# Patient Record
Sex: Female | Born: 1955 | Race: White | Hispanic: No | Marital: Single | Smoking: Never smoker
Health system: Southern US, Community
[De-identification: ages and names within clinical notes are randomized; demographics above are authoritative.]

---

## 2009-01-30 ENCOUNTER — Inpatient Hospital Stay: Payer: Self-pay | Admitting: Psychiatry

## 2021-12-04 ENCOUNTER — Emergency Department
Admission: EM | Admit: 2021-12-04 | Discharge: 2021-12-05 | Disposition: A | Discharge status: Other Acute Inpatient Hospital | Payer: Self-pay | Attending: Emergency Medicine | Admitting: Emergency Medicine

## 2021-12-04 ENCOUNTER — Emergency Department: Payer: Self-pay

## 2021-12-04 DIAGNOSIS — Z79899 Other long term (current) drug therapy: Secondary | ICD-10-CM | POA: Insufficient documentation

## 2021-12-04 DIAGNOSIS — F29 Unspecified psychosis not due to a substance or known physiological condition: Secondary | ICD-10-CM | POA: Insufficient documentation

## 2021-12-04 DIAGNOSIS — F22 Delusional disorders: Secondary | ICD-10-CM | POA: Diagnosis present

## 2021-12-04 DIAGNOSIS — Z20822 Contact with and (suspected) exposure to covid-19: Secondary | ICD-10-CM | POA: Insufficient documentation

## 2021-12-04 LAB — COMPREHENSIVE METABOLIC PANEL
ALT: 41 U/L (ref 0–44)
AST: 34 U/L (ref 15–41)
Albumin: 4.3 g/dL (ref 3.5–5.0)
Alkaline Phosphatase: 53 U/L (ref 38–126)
Anion gap: 6 (ref 5–15)
BUN: 14 mg/dL (ref 8–23)
CO2: 25 mmol/L (ref 22–32)
Calcium: 9.2 mg/dL (ref 8.9–10.3)
Chloride: 110 mmol/L (ref 98–111)
Creatinine, Ser: 0.78 mg/dL (ref 0.44–1.00)
GFR, Estimated: >60 mL/min (ref >60)
Glucose, Bld: 101 mg/dL — ABNORMAL HIGH (ref 70–99)
Potassium: 4.4 mmol/L (ref 3.5–5.1)
Sodium: 141 mmol/L (ref 135–145)
Total Bilirubin: 0.4 mg/dL (ref 0.3–1.2)
Total Protein: 7.1 g/dL (ref 6.5–8.1)

## 2021-12-04 LAB — URINE DRUG SCREEN, QUALITATIVE (ARMC ONLY)
Amphetamines, Ur Screen: NOT DETECTED
Barbiturates, Ur Screen: NOT DETECTED
Benzodiazepine, Ur Scrn: NOT DETECTED
Cannabinoid 50 Ng, Ur ~~LOC~~: NOT DETECTED
Cocaine Metabolite,Ur ~~LOC~~: NOT DETECTED
MDMA (Ecstasy)Ur Screen: NOT DETECTED
Methadone Scn, Ur: NOT DETECTED
Opiate, Ur Screen: NOT DETECTED
Phencyclidine (PCP) Ur S: NOT DETECTED
Tricyclic, Ur Screen: NOT DETECTED

## 2021-12-04 LAB — CBC
HCT: 43.7 % (ref 36.0–46.0)
Hemoglobin: 13.8 g/dL (ref 12.0–15.0)
MCH: 29.9 pg (ref 26.0–34.0)
MCHC: 31.6 g/dL (ref 30.0–36.0)
MCV: 94.6 fL (ref 80.0–100.0)
Platelets: 160 10*3/uL (ref 150–400)
RBC: 4.62 MIL/uL (ref 3.87–5.11)
RDW: 12.7 % (ref 11.5–15.5)
WBC: 7.3 10*3/uL (ref 4.0–10.5)
nRBC: 0 % (ref 0.0–0.2)

## 2021-12-04 LAB — RESP PANEL BY RT-PCR (FLU A&B, COVID) ARPGX2
Influenza A by PCR: NEGATIVE
Influenza B by PCR: NEGATIVE
SARS Coronavirus 2 by RT PCR: NEGATIVE

## 2021-12-04 LAB — ETHANOL: Alcohol, Ethyl (B): <10 mg/dL (ref <10)

## 2021-12-04 LAB — ACETAMINOPHEN LEVEL: Acetaminophen (Tylenol), Serum: <10 ug/mL — ABNORMAL LOW (ref 10–30)

## 2021-12-04 LAB — SALICYLATE LEVEL: Salicylate Lvl: <7 mg/dL — ABNORMAL LOW (ref 7.0–30.0)

## 2021-12-04 NOTE — ED Notes (Addendum)
Pt belongings include one dress, two yellow shoes, one yellow bangle, one yellow necklace, one slip. 1/1 belongings bag.

## 2021-12-04 NOTE — ED Provider Notes (Signed)
[facility] Provider Note    Event Date/Time   First MD Initiated Contact with Patient 12/04/21 1654     (approximate)   History   Psychiatric Evaluation   HPI  Leyli Montemayor is a 66 y.o. female with no available past medical history who presents for psychiatric evaluation.  Per IVC paperwork patient was found wandering through neighborhood, appeared confused, complaining of fire ants.  Patient is significantly fixated on fire ants.  She also reports that she is "the neighborhood watch ".  She also reports that she has been arrested several times before and that there is bad blood in the neighborhood     Physical Exam   Triage Vital Signs: ED Triage Vitals  Enc Vitals Group     BP 12/04/21 1652 (!) 143/75     Pulse Rate 12/04/21 1652 (!) 119     Resp 12/04/21 1652 18     Temp 12/04/21 1652 98.9 F (37.2 C)     Temp Source 12/04/21 1652 Oral     SpO2 12/04/21 1652 94 %     Weight 12/04/21 1654 81.6 kg (180 lb)     Height --      Head Circumference --      Peak Flow --      Pain Score 12/04/21 1652 0     Pain Loc --      Pain Edu? --      Excl. in GC? --     Most recent vital signs: Vitals:   12/04/21 1652 12/04/21 1928  BP: (!) 143/75 139/81  Pulse: (!) 119 (!) 134  Resp: 18 18  Temp: 98.9 F (37.2 C) 98.1 F (36.7 C)  SpO2: 94% 100%     General: Awake, no distress.  CV:  Good peripheral perfusion.  Resp:  Normal effort.  Abd:  No distention.  Other:  Psych: Fixation on fire ants and "controlling them with compost ".  Does know that she is in the emergency department.  No SI or HI reported   ED Results / Procedures / Treatments   Labs (all labs ordered are listed, but only abnormal results are displayed) Labs Reviewed  COMPREHENSIVE METABOLIC PANEL - Abnormal; Notable for the following components:      Result Value   Glucose, Bld 101 (*)    All other components within normal limits  SALICYLATE LEVEL - Abnormal; Notable for  the following components:   Salicylate Lvl <7.0 (*)    All other components within normal limits  ACETAMINOPHEN LEVEL - Abnormal; Notable for the following components:   Acetaminophen (Tylenol), Serum <10 (*)    All other components within normal limits  RESP PANEL BY RT-PCR (FLU A&B, COVID) ARPGX2  ETHANOL  CBC  URINE DRUG SCREEN, QUALITATIVE ([facility] ONLY)     EKG     RADIOLOGY     PROCEDURES:  Critical Care performed:   Procedures   MEDICATIONS ORDERED IN ED: Medications - No data to display   IMPRESSION / MDM / ASSESSMENT AND PLAN / ED COURSE  I reviewed the triage vital signs and the nursing notes. Patient's presentation is most consistent with acute illness / injury with system symptoms.  Patient presents under IVC paperwork with the police for abnormal behavior.  After eval I have completed the IVC  The patient does appear to be suffering from psychosis  Lab work reviewed and is reassuring, the patient is medically cleared for psychiatric evaluation CBC is normal, BMP is  normal, acetaminophen and salicylate levels are normal.  UDS was clear   The patient has been placed in psychiatric observation due to the need to provide a safe environment for the patient while obtaining psychiatric consultation and evaluation, as well as ongoing medical and medication management to treat the patient's condition.  The patient has been placed under full IVC at this time.    Patient seen by psychiatry, they do plan to admit the patient      FINAL CLINICAL IMPRESSION(S) / ED DIAGNOSES   Final diagnoses:  Psychosis, unspecified psychosis type (HCC)     Rx / DC Orders   ED Discharge Orders     None        Note:  This document was prepared using Dragon voice recognition software and may include unintentional dictation errors.   Jene Every, MD 12/04/21 2300

## 2021-12-04 NOTE — ED Notes (Signed)
IVC, pt came in on IVC, pend MD eval

## 2021-12-04 NOTE — ED Notes (Signed)
Pt given nighttime snack. 
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
## 2021-12-04 NOTE — BH Assessment (Signed)
Referral information for Psychiatric Hospitalization faxed to:  [facility] [phone]) Unit unable to accept pts at this time.  [facility] [phone]),   [facility] [phone]) [phone]  [facility] [phone]),  [facility] [phone]- [phone]),   [facility] [phone] or [phone]),

## 2021-12-05 ENCOUNTER — Encounter: Payer: Self-pay | Admitting: Psychiatry

## 2021-12-05 ENCOUNTER — Other Ambulatory Visit: Payer: Self-pay

## 2021-12-05 ENCOUNTER — Inpatient Hospital Stay
Admission: AD | Admit: 2021-12-05 | Discharge: 2021-12-09 | DRG: 885 | Disposition: A | Discharge status: Home / Self Care | Payer: 59 | Source: Intra-hospital | Attending: Psychiatry | Admitting: Psychiatry

## 2021-12-05 DIAGNOSIS — Z592 Discord with neighbors, lodgers and landlord: Secondary | ICD-10-CM

## 2021-12-05 DIAGNOSIS — W57XXXA Bitten or stung by nonvenomous insect and other nonvenomous arthropods, initial encounter: Secondary | ICD-10-CM | POA: Diagnosis present

## 2021-12-05 DIAGNOSIS — F32A Depression, unspecified: Secondary | ICD-10-CM | POA: Diagnosis present

## 2021-12-05 DIAGNOSIS — F419 Anxiety disorder, unspecified: Secondary | ICD-10-CM | POA: Diagnosis present

## 2021-12-05 DIAGNOSIS — F29 Unspecified psychosis not due to a substance or known physiological condition: Secondary | ICD-10-CM | POA: Insufficient documentation

## 2021-12-05 DIAGNOSIS — Z560 Unemployment, unspecified: Secondary | ICD-10-CM

## 2021-12-05 DIAGNOSIS — F22 Delusional disorders: Secondary | ICD-10-CM

## 2021-12-05 DIAGNOSIS — Z639 Problem related to primary support group, unspecified: Secondary | ICD-10-CM

## 2021-12-05 MED ORDER — ALUM & MAG HYDROXIDE-SIMETH 200-200-20 MG/5ML PO SUSP: 30.0000 mL | ORAL | Status: DC | PRN

## 2021-12-05 MED ORDER — MAGNESIUM HYDROXIDE 400 MG/5ML PO SUSP: 30.0000 mL | Freq: Every day | ORAL | Status: DC | PRN

## 2021-12-05 MED ORDER — HYDROXYZINE HCL 10 MG PO TABS: 10.0000 mg | ORAL_TABLET | Freq: Three times a day (TID) | ORAL | Status: DC | PRN

## 2021-12-05 MED ORDER — HYDROXYZINE HCL 10 MG PO TABS
10.0000 mg | ORAL_TABLET | Freq: Three times a day (TID) | ORAL | Status: DC | PRN
Start: 2021-12-05 — End: 2021-12-09

## 2021-12-05 MED ORDER — ACETAMINOPHEN 325 MG PO TABS
650.0000 mg | ORAL_TABLET | Freq: Four times a day (QID) | ORAL | Status: DC | PRN
Start: 2021-12-05 — End: 2021-12-09

## 2021-12-05 NOTE — ED Notes (Signed)
Patient transferred to room 4 BHU, oriented Patient to the unit, will continue to monitor.

## 2021-12-05 NOTE — Progress Notes (Signed)
Patient is alert and oriented x4. Patient speech pressured and tangential. Patient able to answer assessment questions, but frequently changes topics. She is able to be easily redirected. Patient denies SI, HI, and AVH. She says that she is upset because she was arrested several times for trespassing into her neighbors places, and that it was all based on lies and fabrications. Patient also states that her brother and her family are a part of the lies. She says she feels she does not need to be in the hospital and she does not like taking medications.   Patient ambulates independently. Gait steady. However, patient says "I have vertigo". Patient unable to recall when it started. Patient is oriented to the unit and ate dinner tray and took a shower. Patient remains safe on the unit at this time.

## 2021-12-05 NOTE — ED Notes (Signed)
Patient will be transferring to room 30 in [facility] psych. , Nurse called report to [facility].

## 2021-12-05 NOTE — ED Notes (Signed)
Patient is on the phone at this time, states she had to call her attorney, because she has court on Wednesday., Nurse will continue to monitor. Patient is safe, q 15 minute checks and camera surveillance in progress.
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
## 2021-12-05 NOTE — Tx Team (Signed)
Initial Treatment Plan 12/05/2021 6:07 PM [name] LDJ:[id number]   PATIENT STRESSORS: Legal issue   Marital or family conflict     PATIENT STRENGTHS: Capable of independent living  Physical Health    PATIENT IDENTIFIED PROBLEMS: Legal issues  Lack of support                   DISCHARGE CRITERIA:  Improved stabilization in mood, thinking, and/or behavior Need for constant or close observation no longer present  PRELIMINARY DISCHARGE PLAN: Return to previous living arrangement  PATIENT/FAMILY INVOLVEMENT: This treatment plan has been presented to and reviewed with the patient, Aimee Mitchell.  The patient has been given the opportunity to ask questions and make suggestions.  Celene Kras, RN 12/05/2021, 6:07 PM

## 2021-12-05 NOTE — ED Notes (Signed)
Pt given ice chips at this time 

## 2021-12-05 NOTE — BH Assessment (Signed)
Patient is to be admitted to [facility] Unit by Dr. Toni Amend.  Attending Physician will be Dr.  Toni Amend .   Patient has been assigned to room 30, by [facility] Charge Nurse, Marchelle Folks.   Intake Paper Work has been signed and placed on patient chart.  ER staff is aware of the admission: Misty Stanley, ER Secretary   Dr. Fuller Plan, ER MD  Toniann Fail, Patient's Nurse  Ethelene Browns, Patient Access.

## 2021-12-05 NOTE — ED Notes (Signed)
Unlocked bathroom door to allow patient to shower.  Staff continuously monitored dayroom outside of bathroom door during pt shower.  Pt was given hygiene items as well as:  I clean top, 1 clean bottom, with 1 pair of disposable underwear.  Pt changed out into clean clothing.  Staff disposed of all shower supplies. Shower room cleaned and secured for next use.  

## 2021-12-05 NOTE — ED Notes (Signed)
Pt given breakfast tray and drink 

## 2021-12-05 NOTE — ED Notes (Addendum)
Patient continuously exiting room, repeating that she was told last night that she would be free to go and requesting updates on when she will be released. This RN and officers present in the quad telling patient and reiterating that she believes incorrect information and that at this time patient is unable to leave due to IVC order. Patient told to return to room to await disposition and to maintain patient safety. Patient then saying that she has a court order on Wednesday and that she needs to leave.

## 2021-12-05 NOTE — ED Notes (Signed)
Hospital meal provided, pt tolerated w/o complaints.  Waste discarded appropriately.  

## 2021-12-05 NOTE — Progress Notes (Signed)
   12/05/21 1500  Clinical Encounter Type  Visited With Patient  Visit Type Initial  Referral From Nurse  Consult/Referral To Chaplain   Chaplain responded to nurse consult. Chaplain provided a compassionate presence and reflective listening as patient spoke about hospital stay. Chaplain provided a Bible and prayer as requested.

## 2021-12-06 DIAGNOSIS — F22 Delusional disorders: Secondary | ICD-10-CM

## 2021-12-06 LAB — URINALYSIS, COMPLETE (UACMP) WITH MICROSCOPIC
Bilirubin Urine: NEGATIVE
Glucose, UA: NEGATIVE mg/dL
Hgb urine dipstick: NEGATIVE
Ketones, ur: NEGATIVE mg/dL
Nitrite: NEGATIVE
Protein, ur: NEGATIVE mg/dL
Specific Gravity, Urine: 1.02 (ref 1.005–1.030)
pH: 6 (ref 5.0–8.0)

## 2021-12-06 MED ORDER — OLANZAPINE 10 MG IM SOLR
5.0000 mg | Freq: Two times a day (BID) | INTRAMUSCULAR | Status: DC
  Administered 2021-12-06 – 2021-12-08 (×3): 5 mg via INTRAMUSCULAR
  Filled 2021-12-06 (×4): qty 10

## 2021-12-06 MED ORDER — HYDROCORTISONE 0.5 % EX CREA
TOPICAL_CREAM | Freq: Three times a day (TID) | CUTANEOUS | Status: DC | PRN
  Administered 2021-12-08: 1 via TOPICAL
  Filled 2021-12-06: qty 28.35

## 2021-12-06 MED ORDER — OLANZAPINE 5 MG PO TABS
5.0000 mg | ORAL_TABLET | Freq: Two times a day (BID) | ORAL | Status: DC
  Filled 2021-12-06 (×5): qty 1

## 2021-12-06 NOTE — BH Assessment (Addendum)
Received patient sitting in the dayroom, she was invited to sit in on the visit with another patient and the patient confirmed it with this writer that it was ok for the [patient to sit with her. She is currently denying SI/HI, A/V hallucinations. Her mood is upbeat and she is hyper-verbal. Pt. is alert and oriented x3 pleasant and cooperative with this patient encounter but informs this writer that she will not take any psychotic medications. Patient medication schedule and regimen discussed with her.   2212 Patient informed and educated on her HS medication and refused to take the PO Olanzapine 5 mg. Patient is aware that medication is an either or medication and that the injection of 5 mg will be administered. Also, spoke with Mikki Santee, NP to get a second clarification on the Olanzapine 5 mg PO or IM if patient refuses PO Olanzapine.   2247 Patient approached for HS medication injection. She was asked several times if she would except PO olanzapine and patient Stated " No I'm not going to take any medication; its nothing wrong with me." The process of IM injected was explained and pt. Was given  with great resistance but medication administration was successful.  5697 Patient has remained in bed resting with eyes closed. She has frequently changed position and at onetime told the staff to get out of her room.   0630 Patient awaken up in day room. When asked if she needed a cup for coffee  and she wanted to know if when had a certain type of cocco. Patient became angry and wave her hand and walked away. Patient is still angry about receiving IM medication at HS. This Clinical research associate will continue to monitor patient for safety.
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
## 2021-12-06 NOTE — Progress Notes (Signed)
Patient came to nurses station yelling at staff about another patient needing a home and stating that someone here needs to do their damn job.

## 2021-12-06 NOTE — [facility] Suicide Risk Assessment (Signed)
[facility] Admission Suicide Risk Assessment   Nursing information obtained from:  Patient Demographic factors:  NA Current Mental Status:  NA Loss Factors:  NA Historical Factors:  NA Risk Reduction Factors:  NA  Total Time spent with patient: 45 minutes Principal Problem: Delusional disorder (HCC) Diagnosis:  Principal Problem:   Delusional disorder (HCC)  Subjective Data: "This is a scheme with her brother and neighbors to get her out of my house."   Continued Clinical Symptoms:  Alcohol Use Disorder Identification Test Final Score (AUDIT): 1 The "Alcohol Use Disorders Identification Test", Guidelines for Use in Primary Care, Second Edition.  World Science writer [facility]). Score between 0-7:  no or low risk or alcohol related problems. Score between 8-15:  moderate risk of alcohol related problems. Score between 16-19:  high risk of alcohol related problems. Score 20 or above:  warrants further diagnostic evaluation for alcohol dependence and treatment.   CLINICAL FACTORS:   Delusional disorder, paranoid   Musculoskeletal: Strength & Muscle Tone: within normal limits Gait & Station: normal Patient leans: N/A  General Appearance: Casual  Eye Contact:  Good  Speech:  Pressured at times, hyperverbal  Volume:  Normal  Mood:  Anxious  Affect:  Congruent  Thought Process:  Descriptions of Associations: Tangential  Orientation:  Full (Time, Place, and Person)  Thought Content:  Delusions and Tangential  Suicidal Thoughts:  No  Homicidal Thoughts:  No  Memory:  Immediate;   Fair Recent;   Fair Remote;   Fair  Judgement:  Impaired  Insight:  Lacking  Psychomotor Activity:  Increased  Concentration:  Concentration: Poor and Attention Span: Poor  Recall:  Fair  Fund of Knowledge:  Good  Language:  Good  Akathisia:  None  Handed:  Right  AIMS (if indicated):     Assets:  Housing Leisure Time Physical Health Resilience  ADL's:  Intact  Cognition:  Impaired,  Mild   Sleep:         Physical Exam: Physical Exam Vitals and nursing note reviewed.  Constitutional:      Appearance: Normal appearance.  HENT:     Head: Normocephalic.     Nose: Nose normal.  Pulmonary:     Effort: Pulmonary effort is normal.  Musculoskeletal:        General: Normal range of motion.     Cervical back: Normal range of motion.  Neurological:     General: No focal deficit present.     Mental Status: She is alert and oriented to person, place, and time.  Psychiatric:        Attention and Perception: She is inattentive.        Mood and Affect: Mood is anxious and elated.        Speech: Speech is tangential.        Behavior: Behavior is hyperactive.        Thought Content: Thought content is paranoid and delusional.        Cognition and Memory: Cognition is impaired.        Judgment: Judgment is impulsive and inappropriate.    Review of Systems  Neurological:  Positive for dizziness.  Psychiatric/Behavioral:  The patient is nervous/anxious.   All other systems reviewed and are negative.  Blood pressure (!) 145/74, pulse 89, temperature 97.8 F (36.6 C), resp. rate 18, height 5\' 8"  (1.727 m), weight 74.6 kg, SpO2 100 %. Body mass index is 25.01 kg/m.   COGNITIVE FEATURES THAT CONTRIBUTE TO RISK:  None  SUICIDE RISK:   Mild:  Suicidal ideation of limited frequency, intensity, duration, and specificity.  There are no identifiable plans, no associated intent, mild dysphoria and related symptoms, good self-control (both objective and subjective assessment), few other risk factors, and identifiable protective factors, including available and accessible social support.  PLAN OF CARE:  Delusional disorder: Started Zyprexa 5 mg BID  I certify that inpatient services furnished can reasonably be expected to improve the patient's condition.   Nanine Means, NP 12/06/2021, 4:18 PM

## 2021-12-06 NOTE — Progress Notes (Signed)
Patient back at nurses station demanding to see the chaplain and have hydrocortisone cream. MD notified and orders placed

## 2021-12-06 NOTE — Progress Notes (Signed)
Patient is seen in the dayroom interacting with others. She is A&Ox4. She denies SI, HI, and AVH, anxiety, and depression. States she doesn't have any mental issues. She denies pain. Patient is tangential and has flight of ideas when engaged in conversation. She is restless and did not sleep well last night. She is safe on the unit at this time in no current distress. Q 15 min safety checks in place.

## 2021-12-06 NOTE — [facility] Suicide Risk Assessment (Signed)
[place] INPATIENT:  Family/Significant Other Suicide Prevention Education  Suicide Prevention Education:   SPE completed with pt, as pt refused to consent to family contact. SPI pamphlet provided to pt and pt was encouraged to share information with support network, ask questions, and talk about any concerns relating to SPE. Pt denies access to guns/firearms and verbalized understanding of information provided. Mobile Crisis information also provided to pt.  Patient Refusal for Family/Significant Other Suicide Prevention Education: The patient Aimee Mitchell has refused to provide written consent for family/significant other to be provided Family/Significant Other Suicide Prevention Education during admission and/or prior to discharge.  Physician notified.  Yaseen Gilberg A [place] 12/06/2021, 4:43 PM

## 2021-12-06 NOTE — Progress Notes (Signed)
Patient is seen in the dayroom interacting with others. She is AAOx4. She denies SI, HI, and AVH, anxiety, and depression. She denies pain. Patient is tangential and has flight of ideas when engaged in conversation. She is restless and did not sleep well. She stated that her feet has pustules from the ant bites. She is safe on the unit at this time in no current distress. Q 15 min safety checks in place.
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
## 2021-12-06 NOTE — Plan of Care (Signed)
  Problem: Education: Goal: Knowledge of General Education information will improve Description: Including pain rating scale, medication(s)/side effects and non-pharmacologic comfort measures Outcome: Progressing   Problem: Health Behavior/Discharge Planning: Goal: Ability to manage health-related needs will improve Outcome: Progressing   Problem: Clinical Measurements: Goal: Ability to maintain clinical measurements within normal limits will improve Outcome: Progressing Goal: Will remain free from infection Outcome: Progressing Goal: Diagnostic test results will improve Outcome: Progressing Goal: Respiratory complications will improve Outcome: Progressing Goal: Cardiovascular complication will be avoided Outcome: Progressing   Problem: Safety: Goal: Ability to remain free from injury will improve Outcome: Progressing   Problem: Skin Integrity: Goal: Risk for impaired skin integrity will decrease Outcome: Progressing   Problem: Education: Goal: Knowledge of [place] General Education information/materials will improve Outcome: Progressing Goal: Emotional status will improve Outcome: Progressing Goal: Mental status will improve Outcome: Progressing Goal: Verbalization of understanding the information provided will improve Outcome: Progressing   Problem: Pain Managment: Goal: General experience of comfort will improve Outcome: Progressing   Problem: Self-Care: Goal: Ability to participate in self-care as condition permits will improve Outcome: Progressing   Problem: Self-Concept: Goal: Will verbalize positive feelings about self Outcome: Progressing

## 2021-12-07 MED ORDER — TRIAMCINOLONE 0.1 % CREAM:EUCERIN CREAM 1:1
TOPICAL_CREAM | Freq: Two times a day (BID) | CUTANEOUS | Status: DC
  Administered 2021-12-07 – 2021-12-08 (×2): 1 via TOPICAL
  Filled 2021-12-07 (×5): qty 1

## 2021-12-07 NOTE — BH IP Treatment Plan (Signed)
Interdisciplinary Treatment and Diagnostic Plan Update  12/07/2021 Time of Session: 10:30AM Aimee Mitchell MRN: [id number]  Principal Diagnosis: Delusional disorder [facility])  Secondary Diagnoses: Principal Problem:   Delusional disorder ([place])   Current Medications:  Current Facility-Administered Medications  Medication Dose Route Frequency Provider Last Rate Last Admin   acetaminophen (TYLENOL) tablet 650 mg  650 mg Oral Q6H PRN Sherlon Handing, NP       alum & mag hydroxide-simeth (MAALOX/MYLANTA) 200-200-20 MG/5ML suspension 30 mL  30 mL Oral Q4H PRN Sherlon Handing, NP       hydrocortisone cream 0.5 %   Topical TID PRN Clapacs, Madie Reno, MD       hydrOXYzine (ATARAX) tablet 10 mg  10 mg Oral TID PRN Sherlon Handing, NP       magnesium hydroxide (MILK OF MAGNESIA) suspension 30 mL  30 mL Oral Daily PRN Waldon Merl F, NP       OLANZapine (ZYPREXA) tablet 5 mg  5 mg Oral BID Patrecia Pour, NP       Or   OLANZapine (ZYPREXA) injection 5 mg  5 mg Intramuscular BID Patrecia Pour, NP   5 mg at 12/07/21 0809   PTA Medications: No medications prior to admission.    Patient Stressors: Legal issue   Marital or family conflict    Patient Strengths: Capable of independent living  Physical Health   Treatment Modalities: Medication Management, Group therapy, Case management,  1 to 1 session with clinician, Psychoeducation, Recreational therapy.   Physician Treatment Plan for Primary Diagnosis: Delusional disorder [facility]) Long Term Goal(s): Improvement in symptoms so as ready for discharge   Short Term Goals: Ability to identify changes in lifestyle to reduce recurrence of condition will improve Ability to verbalize feelings will improve Ability to disclose and discuss suicidal ideas Ability to demonstrate self-control will improve Ability to identify and develop effective coping behaviors will improve Ability to maintain clinical measurements within normal limits will  improve Compliance with prescribed medications will improve  Medication Management: Evaluate patient's response, side effects, and tolerance of medication regimen.  Therapeutic Interventions: 1 to 1 sessions, Unit Group sessions and Medication administration.  Evaluation of Outcomes: Not Met  Physician Treatment Plan for Secondary Diagnosis: Principal Problem:   Delusional disorder ([place])  Long Term Goal(s): Improvement in symptoms so as ready for discharge   Short Term Goals: Ability to identify changes in lifestyle to reduce recurrence of condition will improve Ability to verbalize feelings will improve Ability to disclose and discuss suicidal ideas Ability to demonstrate self-control will improve Ability to identify and develop effective coping behaviors will improve Ability to maintain clinical measurements within normal limits will improve Compliance with prescribed medications will improve     Medication Management: Evaluate patient's response, side effects, and tolerance of medication regimen.  Therapeutic Interventions: 1 to 1 sessions, Unit Group sessions and Medication administration.  Evaluation of Outcomes: Not Met   RN Treatment Plan for Primary Diagnosis: Delusional disorder ([place]) Long Term Goal(s): Knowledge of disease and therapeutic regimen to maintain health will improve  Short Term Goals: Ability to remain free from injury will improve, Ability to verbalize frustration and anger appropriately will improve, Ability to demonstrate self-control, Ability to participate in decision making will improve, Ability to verbalize feelings will improve, Ability to identify and develop effective coping behaviors will improve, and Compliance with prescribed medications will improve  Medication Management: RN will administer medications as ordered by provider, will assess and evaluate patient's  response and provide education to patient for prescribed medication. RN will report any  adverse and/or side effects to prescribing provider.  Therapeutic Interventions: 1 on 1 counseling sessions, Psychoeducation, Medication administration, Evaluate responses to treatment, Monitor vital signs and CBGs as ordered, Perform/monitor CIWA, COWS, AIMS and Fall Risk screenings as ordered, Perform wound care treatments as ordered.  Evaluation of Outcomes: Not Met   LCSW Treatment Plan for Primary Diagnosis: Delusional disorder [facility]) Long Term Goal(s): Safe transition to appropriate next level of care at discharge, Engage patient in therapeutic group addressing interpersonal concerns.  Short Term Goals: Engage patient in aftercare planning with referrals and resources, Increase social support, Increase ability to appropriately verbalize feelings, Increase emotional regulation, Facilitate acceptance of mental health diagnosis and concerns, and Increase skills for wellness and recovery  Therapeutic Interventions: Assess for all discharge needs, 1 to 1 time with Social worker, Explore available resources and support systems, Assess for adequacy in community support network, Educate family and significant other(s) on suicide prevention, Complete Psychosocial Assessment, Interpersonal group therapy.  Evaluation of Outcomes: Not Met   Progress in Treatment: Attending groups: Yes. Participating in groups: Yes. Taking medication as prescribed: No. Toleration medication: Yes. Family/Significant other contact made: No, will contact:  SPE completed with pt. Pt declined collateral contact Patient understands diagnosis: No. Discussing patient identified problems/goals with staff: No. Medical problems stabilized or resolved: Yes. Denies suicidal/homicidal ideation: Yes. Issues/concerns per patient self-inventory: No. Other: None  New problem(s) identified: No, Describe:  None  New Short Term/Long Term Goal(s): Patient to work towards elimination of symptoms of psychosis, medication management  for mood stabilization; development of comprehensive mental wellness plan.   Patient Goals:  Pt declined to participate in treatment team and declined to provide pt goals.   Discharge Plan or Barriers: CSW will assist pt with development of appropriate discharge/aftercare plan.   Reason for Continuation of Hospitalization: Delusions  Medication stabilization  Estimated Length of Stay: 1-7 days  Last 3 [place] Suicide Severity Risk Score: Flowsheet Row Admission (Current) from 12/05/2021 in [place] ED from 12/04/2021 in [place] No Risk No Risk       Last PHQ 2/9 Scores:     No data to display          Scribe for Treatment Team: Tamieka Rancourt A [place], Latanya Presser 12/07/2021 1:33 PM

## 2021-12-07 NOTE — Progress Notes (Signed)
Patient staring at staff and sizing them up. Patient also came to nurses station and took a Glucerna that wasn't ordered for her. She proceeded to drink the Glucerna and call the staff scammers.

## 2021-12-07 NOTE — Progress Notes (Signed)
Patient alert and oriented Xs 4. Unable to do a complete assessment d/t patients behaviors and uncooperativeness. Patient is tangential with pressured speech. She is also labile and boisterous. Mood and affect is angry and blunted. Eye contact is glaring and intense. Thoughts are disorganized. Patient is aggressive and preoccupied with what is going on with other patients, being a patient advocate, and taking a stand for humanity.

## 2021-12-07 NOTE — Plan of Care (Signed)
Problem: Education: Goal: Knowledge of General Education information will improve Description: Including pain rating scale, medication(s)/side effects and non-pharmacologic comfort measures 12/07/2021 1408 by Doneen Poisson, RN Outcome: Progressing 12/07/2021 1138 by Doneen Poisson, RN Outcome: Not Progressing   Problem: Health Behavior/Discharge Planning: Goal: Ability to manage health-related needs will improve 12/07/2021 1408 by Doneen Poisson, RN Outcome: Progressing 12/07/2021 1138 by Doneen Poisson, RN Outcome: Not Progressing   Problem: Clinical Measurements: Goal: Ability to maintain clinical measurements within normal limits will improve 12/07/2021 1408 by Doneen Poisson, RN Outcome: Progressing 12/07/2021 1138 by Doneen Poisson, RN Outcome: Not Progressing Goal: Will remain free from infection 12/07/2021 1408 by Doneen Poisson, RN Outcome: Progressing 12/07/2021 1138 by Doneen Poisson, RN Outcome: Not Progressing Goal: Diagnostic test results will improve 12/07/2021 1408 by Doneen Poisson, RN Outcome: Progressing 12/07/2021 1138 by Doneen Poisson, RN Outcome: Not Progressing Goal: Respiratory complications will improve 12/07/2021 1408 by Doneen Poisson, RN Outcome: Progressing 12/07/2021 1138 by Doneen Poisson, RN Outcome: Not Progressing Goal: Cardiovascular complication will be avoided 12/07/2021 1408 by Doneen Poisson, RN Outcome: Progressing 12/07/2021 1138 by Doneen Poisson, RN Outcome: Not Progressing   Problem: Activity: Goal: Risk for activity intolerance will decrease 12/07/2021 1408 by Doneen Poisson, RN Outcome: Progressing 12/07/2021 1138 by Doneen Poisson, RN Outcome: Not Progressing   Problem: Nutrition: Goal: Adequate nutrition will be maintained 12/07/2021 1408 by Doneen Poisson, RN Outcome: Progressing 12/07/2021 1138 by Doneen Poisson, RN Outcome: Not Progressing   Problem:  Coping: Goal: Level of anxiety will decrease 12/07/2021 1408 by Doneen Poisson, RN Outcome: Progressing 12/07/2021 1138 by Doneen Poisson, RN Outcome: Not Progressing   Problem: Elimination: Goal: Will not experience complications related to bowel motility 12/07/2021 1408 by Doneen Poisson, RN Outcome: Progressing 12/07/2021 1138 by Doneen Poisson, RN Outcome: Not Progressing Goal: Will not experience complications related to urinary retention 12/07/2021 1408 by Doneen Poisson, RN Outcome: Progressing 12/07/2021 1138 by Doneen Poisson, RN Outcome: Not Progressing   Problem: Pain Managment: Goal: General experience of comfort will improve 12/07/2021 1408 by Doneen Poisson, RN Outcome: Progressing 12/07/2021 1138 by Doneen Poisson, RN Outcome: Not Progressing   Problem: Safety: Goal: Ability to remain free from injury will improve 12/07/2021 1408 by Doneen Poisson, RN Outcome: Progressing 12/07/2021 1138 by Doneen Poisson, RN Outcome: Not Progressing   Problem: Skin Integrity: Goal: Risk for impaired skin integrity will decrease 12/07/2021 1408 by Doneen Poisson, RN Outcome: Progressing 12/07/2021 1138 by Doneen Poisson, RN Outcome: Not Progressing   Problem: Education: Goal: Knowledge of [place] [facility] Education information/materials will improve 12/07/2021 1408 by Doneen Poisson, RN Outcome: Progressing 12/07/2021 1138 by Doneen Poisson, RN Outcome: Not Progressing Goal: Emotional status will improve 12/07/2021 1408 by Doneen Poisson, RN Outcome: Progressing 12/07/2021 1138 by Doneen Poisson, RN Outcome: Not Progressing Goal: Mental status will improve 12/07/2021 1408 by Doneen Poisson, RN Outcome: Progressing 12/07/2021 1138 by Doneen Poisson, RN Outcome: Not Progressing Goal: Verbalization of understanding the information provided will improve 12/07/2021 1408 by Doneen Poisson, RN Outcome:  Progressing 12/07/2021 1138 by Doneen Poisson, RN Outcome: Not Progressing   Problem: Activity: Goal: Interest or engagement in activities will improve 12/07/2021 1408 by Doneen Poisson, RN Outcome: Progressing 12/07/2021 1138 by Doneen Poisson, RN Outcome: Not Progressing Goal: Sleeping patterns will improve 12/07/2021 1408 by  Doneen Poisson, RN Outcome: Progressing 12/07/2021 1138 by Doneen Poisson, RN Outcome: Not Progressing   Problem: Coping: Goal: Ability to verbalize frustrations and anger appropriately will improve 12/07/2021 1408 by Doneen Poisson, RN Outcome: Progressing 12/07/2021 1138 by Doneen Poisson, RN Outcome: Not Progressing Goal: Ability to demonstrate self-control will improve 12/07/2021 1408 by Doneen Poisson, RN Outcome: Progressing 12/07/2021 1138 by Doneen Poisson, RN Outcome: Not Progressing   Problem: Health Behavior/Discharge Planning: Goal: Identification of resources available to assist in meeting health care needs will improve 12/07/2021 1408 by Doneen Poisson, RN Outcome: Progressing 12/07/2021 1138 by Doneen Poisson, RN Outcome: Not Progressing Goal: Compliance with treatment plan for underlying cause of condition will improve 12/07/2021 1408 by Doneen Poisson, RN Outcome: Progressing 12/07/2021 1138 by Doneen Poisson, RN Outcome: Not Progressing   Problem: Physical Regulation: Goal: Ability to maintain clinical measurements within normal limits will improve 12/07/2021 1408 by Doneen Poisson, RN Outcome: Progressing 12/07/2021 1138 by Doneen Poisson, RN Outcome: Not Progressing   Problem: Safety: Goal: Periods of time without injury will increase 12/07/2021 1408 by Doneen Poisson, RN Outcome: Progressing 12/07/2021 1138 by Doneen Poisson, RN Outcome: Not Progressing   Problem: Activity: Goal: Will verbalize the importance of balancing activity with adequate rest periods 12/07/2021  1408 by Doneen Poisson, RN Outcome: Progressing 12/07/2021 1138 by Doneen Poisson, RN Outcome: Not Progressing   Problem: Education: Goal: Will be free of psychotic symptoms 12/07/2021 1408 by Doneen Poisson, RN Outcome: Progressing 12/07/2021 1138 by Doneen Poisson, RN Outcome: Not Progressing Goal: Knowledge of the prescribed therapeutic regimen will improve 12/07/2021 1408 by Doneen Poisson, RN Outcome: Progressing 12/07/2021 1138 by Doneen Poisson, RN Outcome: Not Progressing   Problem: Coping: Goal: Coping ability will improve 12/07/2021 1408 by Doneen Poisson, RN Outcome: Progressing 12/07/2021 1138 by Doneen Poisson, RN Outcome: Not Progressing Goal: Will verbalize feelings 12/07/2021 1408 by Doneen Poisson, RN Outcome: Progressing 12/07/2021 1138 by Doneen Poisson, RN Outcome: Not Progressing   Problem: Health Behavior/Discharge Planning: Goal: Compliance with prescribed medication regimen will improve 12/07/2021 1408 by Doneen Poisson, RN Outcome: Progressing 12/07/2021 1138 by Doneen Poisson, RN Outcome: Not Progressing   Problem: Nutritional: Goal: Ability to achieve adequate nutritional intake will improve 12/07/2021 1408 by Doneen Poisson, RN Outcome: Progressing 12/07/2021 1138 by Doneen Poisson, RN Outcome: Not Progressing   Problem: Role Relationship: Goal: Ability to communicate needs accurately will improve 12/07/2021 1408 by Doneen Poisson, RN Outcome: Progressing 12/07/2021 1138 by Doneen Poisson, RN Outcome: Not Progressing Goal: Ability to interact with others will improve 12/07/2021 1408 by Doneen Poisson, RN Outcome: Progressing 12/07/2021 1138 by Doneen Poisson, RN Outcome: Not Progressing   Problem: Self-Care: Goal: Ability to participate in self-care as condition permits will improve 12/07/2021 1408 by Doneen Poisson, RN Outcome: Progressing 12/07/2021 1138 by Doneen Poisson, RN Outcome: Not Progressing   Problem: Self-Concept: Goal: Will verbalize positive feelings about self 12/07/2021 1408 by Doneen Poisson, RN Outcome: Progressing 12/07/2021 1138 by Doneen Poisson, RN Outcome: Not Progressing   Problem: Safety: Goal: Violent Restraint(s) Outcome: Progressing   Problem: Safety: Goal: Violent Restraint(s) Outcome: Progressing

## 2021-12-07 NOTE — Progress Notes (Addendum)
   12/07/21 0748  Clinical Encounter Type  Visited With Other (Comment) (Note for staff)   Chaplain Sophronia Simas was available last night for support but did not see the consult request until this morning. Will refer to chaplain on duty this a.m. for pt f/u.  Please consider calling or paging the chaplain especially in the evening, even if it may not seem "urgent." Consults are not always seen until the following day; this chaplain may have been able to offer support to pt and staff in a more timely way.  Staff may direct dial on-call phone: [phone] OR send a page for call-back:  [phone]

## 2021-12-07 NOTE — Progress Notes (Signed)
As the breakfast trays arrived to the unit, patient took it upon herself to grab a Glucerna from the cart and go to her room. When this nurse asked her to return the Glucerna she proceeded to drink it and then throw the empty carton across the room. Patient then began yelling, this is elder abuse and you all are scammers. When this nurse returned to give pt her scheduled Zyprexa she refused stating "fuck you" "get the fuck out my room". I attempted to explain to her that she needed her medicine and if she continued I would have to give her an injection. Security was called and once they arrived pt continued yelling, screaming, and calling staff members out of their name. She then threw a pillow at this writer stating "I'm not taking anything, "I am not psychotic". Patient then proceeded to barricade herself in between the bed and wall. Security was able to move the bed and restrain the patient. Then Zyprexa was administered via IM in the right deltoid. Patient was then left alone to calm down with 15 minute security checks in place.

## 2021-12-07 NOTE — Progress Notes (Signed)
  Chaplain On-Call responded to Spiritual Care Consult Order from Gonzella Lex, MD, to support the patient. After receiving medical update from [place] met the patient in her room.  In our pleasant conversation, the patient spoke often of her faith and beliefs, which include a belief in God and discovering strength and hope from her love of nature. She stated her appreciation for support from Chaplains as well.  Chaplain provided much spiritual and emotional support, and prayer.  Chaplain Pollyann Samples M.Div., [facility]

## 2021-12-07 NOTE — BH Assessment (Signed)
Received patient sitting in the day room. She is pleasant and cooperative for the moment. She is denying SI/HI, A/V hallucinations. Her mood is bright but her affect at times is blunt.   2158 Patient offered the scheduled dose of Olanzapine 5 mg PO x 3 she began to jump up out of bed and walk toward this writer in an angry manner demanding to speak with the physician and demanding to see the provider's notes with her diagnoses for taking this medication. She also, demanded to see the notes that describe the criteria for the diagnosis. Patient was informed what the diagnosis and encouraged the patient to speak with the provider in the morning and that this writer will inform the AM shift of her request.   2210 Security called for medication administration. Security arrived at 2215 and this Clinical research associate  along with charge nurse approached patient's door and it was baracaded from the inside and security was able to push the door opened . Patient asked by security leader three times to take the medication by mouth and patient refused three times. This Clinical research associate asked patient again if she would take the medication patient agreed and the PO medication  was obtained and the patient took the medication out of this writer's hand  through the cup of water and brushed up against security staff in protest of not taking any medication.   2217 IM Olanzapine 5 mg was administered in right gluteal without leakage or skin reaction. A medication hold was used to perform administration due to patient's aggressive behavior.  2225 Patient began to threaten staff and pace back and forward on the unit while yelling. She was asked not to go down the opposite hallway yelling and was refusing to  to stop or to turn around. Patient then came to the desk demanding the providers name  and charted information on why she was prescribed.   2245 Patient walked down the hall to her room.   2300 Patient  assessed and she is showing no signs of  side-effects and adverse reaction to Olanzapine.  0000 Patient is resting quietly in bed with eyes closed. This Clinical research associate will continue to monitor patient for safety.  5400 Patient awaken and wanted to know who took the glasses out of her bathroom. Patient was informed that glasses are not allowed in patient rooms. She than requested Cocco and tea. She was given both. Patient was noted with three glasses one with cocco and she began  walk towards her room with the glasses. When asked not to take the glasses to her room patient continued to walk and took the glasses in her room. When this writer asked the patient to give her glasses patient took the hot cup of cocco and attempted to throw it on the nurses. This Clinical research associate will continue to monitor patient for safety.  8676 Patient approached the nurses station verbally abusing the staff. Yelling out of her door down to the nurse's station at staff. Will continue to monitor patient for safety.

## 2021-12-07 NOTE — Progress Notes (Signed)
Recreation Therapy Notes  Date: 12/07/2021   Time: 1:25 pm   Location: Craft room      Behavioral response: N/A   Intervention Topic: Leisure      Discussion/Intervention: Patient refused to attend group.    Clinical Observations/Feedback:  Patient refused to attend group.    Kristiann Noyce LRT/CTRS        Kaisen Ackers 12/07/2021 2:31 PM

## 2021-12-07 NOTE — Plan of Care (Signed)
  Problem: Education: Goal: Knowledge of General Education information will improve Description: Including pain rating scale, medication(s)/side effects and non-pharmacologic comfort measures Outcome: Not Progressing   Problem: Health Behavior/Discharge Planning: Goal: Ability to manage health-related needs will improve Outcome: Not Progressing   Problem: Clinical Measurements: Goal: Ability to maintain clinical measurements within normal limits will improve Outcome: Not Progressing Goal: Will remain free from infection Outcome: Not Progressing Goal: Diagnostic test results will improve Outcome: Not Progressing Goal: Respiratory complications will improve Outcome: Not Progressing Goal: Cardiovascular complication will be avoided Outcome: Not Progressing   Problem: Activity: Goal: Risk for activity intolerance will decrease Outcome: Not Progressing   Problem: Nutrition: Goal: Adequate nutrition will be maintained Outcome: Not Progressing   Problem: Coping: Goal: Level of anxiety will decrease Outcome: Not Progressing   Problem: Elimination: Goal: Will not experience complications related to bowel motility Outcome: Not Progressing Goal: Will not experience complications related to urinary retention Outcome: Not Progressing   Problem: Pain Managment: Goal: General experience of comfort will improve Outcome: Not Progressing   Problem: Safety: Goal: Ability to remain free from injury will improve Outcome: Not Progressing   Problem: Education: Goal: Knowledge of [place] General Education information/materials will improve Outcome: Not Progressing Goal: Emotional status will improve Outcome: Not Progressing Goal: Mental status will improve Outcome: Not Progressing Goal: Verbalization of understanding the information provided will improve Outcome: Not Progressing   Problem: Activity: Goal: Interest or engagement in activities will improve Outcome: Not  Progressing Goal: Sleeping patterns will improve Outcome: Not Progressing   Problem: Coping: Goal: Ability to verbalize frustrations and anger appropriately will improve Outcome: Not Progressing Goal: Ability to demonstrate self-control will improve Outcome: Not Progressing   Problem: Health Behavior/Discharge Planning: Goal: Identification of resources available to assist in meeting health care needs will improve Outcome: Not Progressing Goal: Compliance with treatment plan for underlying cause of condition will improve Outcome: Not Progressing   Problem: Physical Regulation: Goal: Ability to maintain clinical measurements within normal limits will improve Outcome: Not Progressing   Problem: Safety: Goal: Periods of time without injury will increase Outcome: Not Progressing   Problem: Activity: Goal: Will verbalize the importance of balancing activity with adequate rest periods Outcome: Not Progressing   Problem: Education: Goal: Will be free of psychotic symptoms Outcome: Not Progressing Goal: Knowledge of the prescribed therapeutic regimen will improve Outcome: Not Progressing   Problem: Coping: Goal: Coping ability will improve Outcome: Not Progressing Goal: Will verbalize feelings Outcome: Not Progressing   Problem: Health Behavior/Discharge Planning: Goal: Compliance with prescribed medication regimen will improve Outcome: Not Progressing   Problem: Nutritional: Goal: Ability to achieve adequate nutritional intake will improve Outcome: Not Progressing   Problem: Role Relationship: Goal: Ability to communicate needs accurately will improve Outcome: Not Progressing Goal: Ability to interact with others will improve Outcome: Not Progressing   Problem: Safety: Goal: Ability to redirect hostility and anger into socially appropriate behaviors will improve Outcome: Not Progressing Goal: Ability to remain free from injury will improve Outcome: Not Progressing    Problem: Self-Care: Goal: Ability to participate in self-care as condition permits will improve Outcome: Not Progressing   Problem: Self-Concept: Goal: Will verbalize positive feelings about self Outcome: Not Progressing

## 2021-12-07 NOTE — Progress Notes (Signed)
[facility] MD Progress Note  12/07/2021 1:38 PM Aimee Mitchell  MRN:  [id number] Subjective: Follow-up for this 66 year old woman brought to the hospital with reports of strange behavior and trespassing on a neighbor's yards.  Patient was cooperative with the interview but a bit tangential.  Some of her information was hard to follow and some of it sounds delusional such as her belief that her father or brother are involved in getting her in the hospital.  Patient continues to insist she did nothing wrong and was only picking up trash in the neighborhood.  She denies any current hallucinations.  She was a little tangential but not frankly bizarre.  No insight.  Only complaint physically is of the multiple inflamed insect bites on her feet.  Patient received IM medicine after refusing the olanzapine. Principal Problem: Delusional disorder (HCC) Diagnosis: Principal Problem:   Delusional disorder (HCC)  Total Time spent with patient: 30 minutes  Past Psychiatric History: Past history is unclear.  At one point the patient said that her family had had her committed in the past but she could provide no details around that.  No history of psychiatric involvement found in the chart.  Past Medical History: History reviewed. No pertinent past medical history. History reviewed. No pertinent surgical history. Family History: History reviewed. No pertinent family history. Family Psychiatric  History: Unknown Social History:  Social History   Substance and Sexual Activity  Alcohol Use Not Currently     Social History   Substance and Sexual Activity  Drug Use Yes   Types: Marijuana    Social History   Socioeconomic History   Marital status: Single    Spouse name: Not on file   Number of children: Not on file   Years of education: Not on file   Highest education level: Not on file  Occupational History   Not on file  Tobacco Use   Smoking status: Never   Smokeless tobacco: Never  Substance and Sexual  Activity   Alcohol use: Not Currently   Drug use: Yes    Types: Marijuana   Sexual activity: Not on file  Other Topics Concern   Not on file  Social History Narrative   Not on file   Social Determinants of Health   Financial Resource Strain: Not on file  Food Insecurity: Not on file  Transportation Needs: Not on file  Physical Activity: Not on file  Stress: Not on file  Social Connections: Not on file   Additional Social History:                         Sleep: Negative  Appetite:  Fair  Current Medications: Current Facility-Administered Medications  Medication Dose Route Frequency Provider Last Rate Last Admin   acetaminophen (TYLENOL) tablet 650 mg  650 mg Oral Q6H PRN Vanetta MuldersBarthold, Louise F, NP       alum & mag hydroxide-simeth (MAALOX/MYLANTA) 200-200-20 MG/5ML suspension 30 mL  30 mL Oral Q4H PRN Vanetta MuldersBarthold, Louise F, NP       hydrocortisone cream 0.5 %   Topical TID PRN Vernetta Dizdarevic, Jackquline DenmarkJohn T, MD       hydrOXYzine (ATARAX) tablet 10 mg  10 mg Oral TID PRN Vanetta MuldersBarthold, Louise F, NP       magnesium hydroxide (MILK OF MAGNESIA) suspension 30 mL  30 mL Oral Daily PRN Gabriel CirriBarthold, Louise F, NP       OLANZapine (ZYPREXA) tablet 5 mg  5 mg Oral BID Lord,  Herminio Heads, NP       Or   OLANZapine [facility]) injection 5 mg  5 mg Intramuscular BID Charm Rings, NP   5 mg at 12/07/21 0809   triamcinolone 0.1 % cream : eucerin cream, 1:1   Topical BID Leni Pankonin, Jackquline Denmark, MD        Lab Results:  Results for orders placed or performed during the hospital encounter of 12/05/21 (from the past 48 hour(s))  Urinalysis, Complete w Microscopic Urine, Clean Catch     Status: Abnormal   Collection Time: 12/06/21  2:00 PM  Result Value Ref Range   Color, Urine YELLOW (A) YELLOW   APPearance HAZY (A) CLEAR   Specific Gravity, Urine 1.020 1.005 - 1.030   pH 6.0 5.0 - 8.0   Glucose, UA NEGATIVE NEGATIVE mg/dL   Hgb urine dipstick NEGATIVE NEGATIVE   Bilirubin Urine NEGATIVE NEGATIVE   Ketones, ur  NEGATIVE NEGATIVE mg/dL   Protein, ur NEGATIVE NEGATIVE mg/dL   Nitrite NEGATIVE NEGATIVE   Leukocytes,Ua TRACE (A) NEGATIVE   RBC / HPF 0-5 0 - 5 RBC/hpf   WBC, UA 0-5 0 - 5 WBC/hpf   Bacteria, UA RARE (A) NONE SEEN   Squamous Epithelial / LPF 0-5 0 - 5   Mucus PRESENT     Comment: Performed at [facility], [address] [place], [place] [address]    Blood Alcohol level:  Lab Results  Component Value Date   [facility] <10 12/04/2021    Metabolic Disorder Labs: No results found for: "HGBA1C", "MPG" No results found for: "PROLACTIN" No results found for: "CHOL", "TRIG", "HDL", "CHOLHDL", "VLDL", "LDLCALC"  Physical Findings: AIMS:  , ,  ,  ,    CIWA:    COWS:     Musculoskeletal: Strength & Muscle Tone: within normal limits Gait & Station: normal Patient leans: N/A  Psychiatric Specialty Exam:  Presentation  General Appearance: Bizarre  Eye Contact:Fair  Speech:Pressured  Speech Volume:Normal  Handedness:Right   Mood and Affect  Mood:Anxious; Labile  Affect:Congruent   Thought Process  Thought Processes:Disorganized  Descriptions of Associations:Loose  Orientation:Full (Time, Place and Person)  Thought Content:Abstract Reasoning; Scattered  History of Schizophrenia/Schizoaffective disorder:No  Duration of Psychotic Symptoms:No data recorded Hallucinations:No data recorded Ideas of Reference:None  Suicidal Thoughts:No data recorded Homicidal Thoughts:No data recorded  Sensorium  Memory:Immediate Poor; Remote Fair  Judgment:Impaired  Insight:Lacking   Executive Functions  Concentration:Poor  Attention Span:Fair  Recall:Poor  Fund of Knowledge:Fair  Language:Fair   Psychomotor Activity  Psychomotor Activity:No data recorded  Assets  Assets:Communication Skills; Financial Resources/Insurance; Housing   Sleep  Sleep:No data recorded   Physical Exam: Physical Exam Vitals and nursing note reviewed.  Constitutional:       Appearance: Normal appearance.  HENT:     Head: Normocephalic and atraumatic.     Mouth/Throat:     Pharynx: Oropharynx is clear.  Eyes:     Pupils: Pupils are equal, round, and reactive to light.  Cardiovascular:     Rate and Rhythm: Normal rate and regular rhythm.  Pulmonary:     Effort: Pulmonary effort is normal.     Breath sounds: Normal breath sounds.  Abdominal:     General: Abdomen is flat.     Palpations: Abdomen is soft.  Musculoskeletal:        General: Normal range of motion.  Skin:    General: Skin is warm and dry.       Neurological:  General: No focal deficit present.     Mental Status: She is alert. Mental status is at baseline.  Psychiatric:        Attention and Perception: Attention normal.        Mood and Affect: Mood normal.        Speech: Speech is tangential.        Behavior: Behavior is cooperative.        Thought Content: Thought content is paranoid.        Cognition and Memory: Memory is impaired.        Judgment: Judgment is impulsive.    Review of Systems  Constitutional: Negative.   HENT: Negative.    Eyes: Negative.   Respiratory: Negative.    Cardiovascular: Negative.   Gastrointestinal: Negative.   Musculoskeletal: Negative.   Skin: Negative.   Neurological: Negative.   Psychiatric/Behavioral:  Positive for memory loss. Negative for depression, hallucinations, substance abuse and suicidal ideas. The patient is not nervous/anxious and does not have insomnia.    Blood pressure 115/63, pulse 88, temperature 98.5 F (36.9 C), temperature source Oral, resp. rate 18, height 5\' 8"  (1.727 m), weight 74.6 kg, SpO2 100 %. Body mass index is 25.01 kg/m.   Treatment Plan Summary: Medication management and Plan Little information available.  Patient has not provided a contact number.  Little information in the chart.  Not grossly bizarre and certainly not agitated on the unit but insight is limited and does seem to have had some paranoid  odd behavior at home.  Unclear if this is part of an ongoing pattern or not.  Medically she does have a lot of insect bites on her feet and we have ordered triamcinolone cream for this.  Tried to engage her in educational and therapeutic activities.  Team should try to encourage her to provide collateral information phone numbers.  I agree with continuing the olanzapine for now as she does seem to be having some delusional and odd thinking.  , MD 12/07/2021, 1:38 PM

## 2021-12-08 DIAGNOSIS — F22 Delusional disorders: Secondary | ICD-10-CM | POA: Diagnosis not present

## 2021-12-08 NOTE — Progress Notes (Signed)
   12/08/21 1440  Clinical Encounter Type  Visited With Patient not available   Chaplain Tawnee Clegg attempted to f/u with pt but she was engaged in group.  Will continue to follow as needed.

## 2021-12-08 NOTE — Group Note (Addendum)
[facility] LCSW Group Therapy Note   Group Date: 12/08/2021 Start Time: 1415 End Time: 1520   Type of Therapy/Topic:  Group Therapy:  Balance in Life  Participation Level:  Active   Description of Group:    This group will address the concept of balance and how it feels and looks when one is unbalanced. Patients will be encouraged to process areas in their lives that are out of balance, and identify reasons for remaining unbalanced. Facilitators will guide patients utilizing problem- solving interventions to address and correct the stressor making their life unbalanced. Understanding and applying boundaries will be explored and addressed for obtaining  and maintaining a balanced life. Patients will be encouraged to explore ways to assertively make their unbalanced needs known to significant others in their lives, using other group members and facilitator for support and feedback.  Therapeutic Goals: Patient will identify two or more emotions or situations they have that consume much of in their lives. Patient will identify signs/triggers that life has become out of balance:  Patient will identify two ways to set boundaries in order to achieve balance in their lives:  Patient will demonstrate ability to communicate their needs through discussion and/or role plays  Summary of Patient Progress:    Patient was present for the entirety of the group session. Patient was an active listener and participated in the topic of discussion, provided helpful advice to others, and added nuance to topic of conversation. She stated that she enjoyed journaling as a way to increase wellness and stated that taking time was good for her wellbeing. Patient has a normal mood and was able to be redirected when needed.    Therapeutic Modalities:   Cognitive Behavioral Therapy Solution-Focused Therapy Assertiveness Training   Derrell Milanes A [place], LCSWA

## 2021-12-08 NOTE — Progress Notes (Signed)
[facility] MD Progress Note  12/08/2021 5:58 PM Aimee Mitchell  MRN:  [id number] Subjective: Follow-up with this 66 year old woman brought to the hospital under IVC because of reports of bizarre behavior in her neighborhood.  Spoke with the patient for a while today and listened to her account of her recent troubles with her neighbors.  Putting the whole story together and adding her behavior it is very difficult in fact to me at this point impossible to know whether she is having a manic episode or whether this is her baseline personality which may have some odd and dysfunctional elements to it.  I do know that she has not been threatening to anyone else not been violent on the unit not been trying to harm herself.  She is consistently refusing medication.  Patient was given education about the fact that this could be a manic episode in the treatment could save her a lot of difficulty in the future but continues to refuse medication Principal Problem: Delusional disorder ([place]) Diagnosis: Principal Problem:   Delusional disorder ([place])  Total Time spent with patient: 30 minutes  Past Psychiatric History: No details.  She admits that many years ago she had a hospitalization but claims to know nothing about it  Past Medical History: History reviewed. No pertinent past medical history. History reviewed. No pertinent surgical history. Family History: History reviewed. No pertinent family history. Family Psychiatric  History: See previous Social History:  Social History   Substance and Sexual Activity  Alcohol Use Not Currently     Social History   Substance and Sexual Activity  Drug Use Yes   Types: Marijuana    Social History   Socioeconomic History   Marital status: Single    Spouse name: Not on file   Number of children: Not on file   Years of education: Not on file   Highest education level: Not on file  Occupational History   Not on file  Tobacco Use   Smoking status: Never   Smokeless  tobacco: Never  Substance and Sexual Activity   Alcohol use: Not Currently   Drug use: Yes    Types: Marijuana   Sexual activity: Not on file  Other Topics Concern   Not on file  Social History Narrative   Not on file   Social Determinants of Health   Financial Resource Strain: Not on file  Food Insecurity: Not on file  Transportation Needs: Not on file  Physical Activity: Not on file  Stress: Not on file  Social Connections: Not on file   Additional Social History:                         Sleep: Fair  Appetite:  Fair  Current Medications: Current Facility-Administered Medications  Medication Dose Route Frequency Provider Last Rate Last Admin   acetaminophen (TYLENOL) tablet 650 mg  650 mg Oral Q6H PRN Sherlon Handing, NP       alum & mag hydroxide-simeth (MAALOX/MYLANTA) 200-200-20 MG/5ML suspension 30 mL  30 mL Oral Q4H PRN Waldon Merl F, NP       hydrocortisone cream 0.5 %   Topical TID PRN Ivyanna Sibert, Madie Reno, MD   1 Application at [id number]   hydrOXYzine (ATARAX) tablet 10 mg  10 mg Oral TID PRN Sherlon Handing, NP       magnesium hydroxide (MILK OF MAGNESIA) suspension 30 mL  30 mL Oral Daily PRN Sherlon Handing, NP  OLANZapine (ZYPREXA) tablet 5 mg  5 mg Oral BID Charm Rings, NP       Or   OLANZapine [facility]) injection 5 mg  5 mg Intramuscular BID Charm Rings, NP   5 mg at 12/08/21 1129   triamcinolone 0.1 % cream : eucerin cream, 1:1   Topical BID Maksymilian Mabey, Jackquline Denmark, MD   1 Application at 12/08/21 1057    Lab Results: No results found for this or any previous visit (from the past 48 hour(s)).  Blood Alcohol level:  Lab Results  Component Value Date   ETH <10 12/04/2021    Metabolic Disorder Labs: No results found for: "HGBA1C", "MPG" No results found for: "PROLACTIN" No results found for: "CHOL", "TRIG", "HDL", "CHOLHDL", "VLDL", "LDLCALC"  Physical Findings: AIMS: Facial and Oral Movements Muscles of Facial  Expression: None, normal Lips and Perioral Area: None, normal Jaw: None, normal Tongue: None, normal,Extremity Movements Upper (arms, wrists, hands, fingers): None, normal Lower (legs, knees, ankles, toes): None, normal, Trunk Movements Neck, shoulders, hips: None, normal, Overall Severity Severity of abnormal movements (highest score from questions above): None, normal Incapacitation due to abnormal movements: None, normal Patient's awareness of abnormal movements (rate only patient's report): No Awareness, Dental Status Current problems with teeth and/or dentures?: No Does patient usually wear dentures?: No  CIWA:    COWS:     Musculoskeletal: Strength & Muscle Tone: within normal limits Gait & Station: normal Patient leans: N/A  Psychiatric Specialty Exam:  Presentation  General Appearance: Bizarre  Eye Contact:Fair  Speech:Pressured  Speech Volume:Normal  Handedness:Right   Mood and Affect  Mood:Anxious; Labile  Affect:Congruent   Thought Process  Thought Processes:Disorganized  Descriptions of Associations:Loose  Orientation:Full (Time, Place and Person)  Thought Content:Abstract Reasoning; Scattered  History of Schizophrenia/Schizoaffective disorder:No  Duration of Psychotic Symptoms:No data recorded Hallucinations:No data recorded Ideas of Reference:None  Suicidal Thoughts:No data recorded Homicidal Thoughts:No data recorded  Sensorium  Memory:Immediate Poor; Remote Fair  Judgment:Impaired  Insight:Lacking   Executive Functions  Concentration:Poor  Attention Span:Fair  Recall:Poor  Fund of Knowledge:Fair  Language:Fair   Psychomotor Activity  Psychomotor Activity:No data recorded  Assets  Assets:Communication Skills; Financial Resources/Insurance; Housing   Sleep  Sleep:No data recorded   Physical Exam: Physical Exam Vitals and nursing note reviewed.  Constitutional:      Appearance: Normal appearance.  HENT:      Head: Normocephalic and atraumatic.     Mouth/Throat:     Pharynx: Oropharynx is clear.  Eyes:     Pupils: Pupils are equal, round, and reactive to light.  Cardiovascular:     Rate and Rhythm: Normal rate and regular rhythm.  Pulmonary:     Effort: Pulmonary effort is normal.     Breath sounds: Normal breath sounds.  Abdominal:     General: Abdomen is flat.     Palpations: Abdomen is soft.  Musculoskeletal:        General: Normal range of motion.  Skin:    General: Skin is warm and dry.  Neurological:     General: No focal deficit present.     Mental Status: She is alert. Mental status is at baseline.  Psychiatric:        Attention and Perception: Attention normal.        Mood and Affect: Mood normal. Affect is labile.        Speech: Speech is tangential.        Behavior: Behavior is agitated. Behavior is not aggressive.  Thought Content: Thought content is paranoid.        Cognition and Memory: Cognition normal.    Review of Systems  Constitutional: Negative.   HENT: Negative.    Eyes: Negative.   Respiratory: Negative.    Cardiovascular: Negative.   Gastrointestinal: Negative.   Musculoskeletal: Negative.   Skin: Negative.   Neurological: Negative.   Psychiatric/Behavioral:  Negative for depression, hallucinations, memory loss, substance abuse and suicidal ideas. The patient is not nervous/anxious and does not have insomnia.    Blood pressure 119/81, pulse 88, temperature 98 F (36.7 C), resp. rate 18, height 5\' 8"  (1.727 m), weight 74.6 kg, SpO2 100 %. Body mass index is 25.01 kg/m.   Treatment Plan Summary: Plan unclear diagnosis.  This is a "he said/she said" situation without definite evidence of unquestioned dangerous mental illness.  In this situation I do not think it is warranted to continue trying to force medicine.  Olanzapine will be discontinued.  Likely discharge tomorrow  , MD 12/08/2021, 5:58 PM

## 2021-12-08 NOTE — Progress Notes (Signed)
Pt's presents with labile mood this shift. Observed to be tangential with loud, pressured speech. Animated on interactions but is easily agitated, verbally abusive, paranoid / suspicious and disruptive when approached for medications. Refused scheduled Zyprexa 5 mg via PO route. Medication given IM at 1129 with 2 minutes manual hold ([id number]) for administration as pt was combative, threw book at Clinical research associate, yelling and cursing at staff. Visible in dayroom interacting with peers post intervention. Safety checks maintained at Q 15 minutes intervals. Support and encouragement offered. Verbal education provided on all medication and effect monitored. Pt tolerated all PO intake well. Continues to need verbal redirections due to intrusive behavior towards staff and peers.

## 2021-12-09 DIAGNOSIS — F22 Delusional disorders: Secondary | ICD-10-CM | POA: Diagnosis not present

## 2021-12-09 NOTE — Progress Notes (Signed)
The patient was logical in conversation, she denies SI, HI & AVH. She voiced of usually not sleeping well through out the night but she reported not wanting to take any further medication.

## 2021-12-09 NOTE — Plan of Care (Signed)
Pt discharge per MD recommendations. All belongings returned. Verbalized understanding of discharge instructions. Denied SI/HI upon discharge and had no sign of distress.

## 2021-12-09 NOTE — Progress Notes (Signed)
  [facility] Adult Case Management Discharge Plan :  Will you be returning to the same living situation after discharge:  Yes,  pt will be returning to her home  At discharge, do you have transportation home?: Yes,  pt's family member will provide transportation Do you have the ability to pay for your medications: No.  Release of information consent forms completed and in the chart;  Patient's signature needed at discharge.  Patient to Follow up at:  Follow-up Information     [facility] Follow up on 12/09/2021.   Why: Though you declined follow up services, please use this resource if the need arises. You can schedule an appointment Saturday June 17th at 8am for an appointment. Walk-in hours are Monday-Sunday 8am-12am. Thanks Contact information: [address] [place] [address] [phone]         [facility] Follow up on 12/09/2021.   Specialty: Urgent Care Why: Though you declined follow up services, here is a resource. Here is information for scheduling your appointment. You can schedule Medication managment appointment Monday June 19th at 7:30am and you can schedule an appointment for therapy Wednesday June 21st at 7:30am. Contact information: [address] [place] [address] [phone]                Next level of care provider has access to [facility] Link:no  Safety Planning and Suicide Prevention discussed: Yes,  SPE completed with pt     Has patient been referred to the Quitline?: N/A patient is not a smoker  Patient has been referred for addiction treatment: N/A  Aimee Mitchell, LCSWA 12/09/2021, 10:59 AM

## 2021-12-09 NOTE — [facility] Suicide Risk Assessment (Signed)
[facility] Discharge Suicide Risk Assessment   Principal Problem: Delusional disorder [facility]) Discharge Diagnoses: Principal Problem:   Delusional disorder (HCC)   Total Time spent with patient: 30 minutes  Musculoskeletal: Strength & Muscle Tone: within normal limits Gait & Station: normal Patient leans: N/A  Psychiatric Specialty Exam  Presentation  General Appearance: Bizarre  Eye Contact:Fair  Speech:Pressured  Speech Volume:Normal  Handedness:Right   Mood and Affect  Mood:Anxious; Labile  Duration of Depression Symptoms: Less than two weeks  Affect:Congruent   Thought Process  Thought Processes:Disorganized  Descriptions of Associations:Loose  Orientation:Full (Time, Place and Person)  Thought Content:Abstract Reasoning; Scattered  History of Schizophrenia/Schizoaffective disorder:No  Duration of Psychotic Symptoms:No data recorded Hallucinations:No data recorded Ideas of Reference:None  Suicidal Thoughts:No data recorded Homicidal Thoughts:No data recorded  Sensorium  Memory:Immediate Poor; Remote Fair  Judgment:Impaired  Insight:Lacking   Executive Functions  Concentration:Poor  Attention Span:Fair  Recall:Poor  Fund of Knowledge:Fair  Language:Fair   Psychomotor Activity  Psychomotor Activity:No data recorded  Assets  Assets:Communication Skills; Financial Resources/Insurance; Housing   Sleep  Sleep:No data recorded  Physical Exam: Physical Exam Vitals and nursing note reviewed.  Constitutional:      Appearance: Normal appearance.  HENT:     Head: Normocephalic and atraumatic.     Mouth/Throat:     Pharynx: Oropharynx is clear.  Eyes:     Pupils: Pupils are equal, round, and reactive to light.  Cardiovascular:     Rate and Rhythm: Normal rate and regular rhythm.  Pulmonary:     Effort: Pulmonary effort is normal.     Breath sounds: Normal breath sounds.  Abdominal:     General: Abdomen is flat.     Palpations:  Abdomen is soft.  Musculoskeletal:        General: Normal range of motion.  Skin:    General: Skin is warm and dry.  Neurological:     General: No focal deficit present.     Mental Status: She is alert. Mental status is at baseline.  Psychiatric:        Attention and Perception: Attention normal.        Mood and Affect: Mood normal.        Speech: Speech normal.        Behavior: Behavior is cooperative.        Thought Content: Thought content normal.        Cognition and Memory: Cognition normal.    Review of Systems  Constitutional: Negative.   HENT: Negative.    Eyes: Negative.   Respiratory: Negative.    Cardiovascular: Negative.   Gastrointestinal: Negative.   Musculoskeletal: Negative.   Skin: Negative.   Neurological: Negative.   Psychiatric/Behavioral: Negative.     Blood pressure 105/72, pulse 87, temperature 98.4 F (36.9 C), resp. rate 18, height 5\' 8"  (1.727 m), weight 74.6 kg, SpO2 100 %. Body mass index is 25.01 kg/m.  Mental Status Per Nursing Assessment::   On Admission:  NA  Demographic Factors:  Caucasian and Living alone  Loss Factors: Legal issues  Historical Factors: Impulsivity  Risk Reduction Factors:   Positive coping skills or problem solving skills  Continued Clinical Symptoms:  Currently Psychotic  Cognitive Features That Contribute To Risk:  None    Suicide Risk:  Minimal: No identifiable suicidal ideation.  Patients presenting with no risk factors but with morbid ruminations; may be classified as minimal risk based on the severity of the depressive symptoms   Follow-up Information  [facility] Follow up on 12/09/2021.   Why: Though you declined follow up services, please use this resource if the need arises. You can schedule an appointment Saturday June 17th at 8am for an appointment. Walk-in hours are Monday-Sunday 8am-12am. Thanks Contact information: [address] [place]  [address] [phone]         [facility] Follow up on 12/09/2021.   Specialty: Urgent Care Why: Though you declined follow up services, here is a resource. Here is information for scheduling your appointment. You can schedule Medication managment appointment Monday June 19th at 7:30am and you can schedule an appointment for therapy Wednesday June 21st at 7:30am. Contact information: [address] [place] [address] [phone]                Plan Of Care/Follow-up recommendations:  Patient denies any suicidal ideation and at no point has there been any concern about any dangerousness to herself or really any dangerousness to others.  Has not been aggressive or violent.  Patient no longer meets commitment criteria.  She is not on any medication.  No evidence of risk to self.  Discharged with recommended voluntary follow-up  Mordecai Rasmussen, MD 12/09/2021, 10:51 AM

## 2021-12-09 NOTE — Discharge Summary (Signed)
Physician Discharge Summary Note  Patient:  Aimee Mitchell is an 66 y.o., female MRN:  [id number] DOB:  1956-05-14 Patient phone:  [phone] (home)  Patient address:   [address] [place] [address],  Total Time spent with patient: 30 minutes  Date of Admission:  12/05/2021 Date of Discharge: 12/09/2021  Reason for Admission:   Patient was admitted under IVC filed in the community alleging psychotic behavior  Principal Problem: Delusional disorder [facility]) Discharge Diagnoses: Principal Problem:   Delusional disorder [facility])   Past Psychiatric History: Patient has admitted to having 1 previous hospitalization many years ago but will not provide or cannot provide any detail about it.  Denies any history of suicidality or violence  Past Medical History: History reviewed. No pertinent past medical history. History reviewed. No pertinent surgical history. Family History: History reviewed. No pertinent family history. Family Psychiatric  History: None reported Social History:  Social History   Substance and Sexual Activity  Alcohol Use Not Currently     Social History   Substance and Sexual Activity  Drug Use Yes   Types: Marijuana    Social History   Socioeconomic History   Marital status: Single    Spouse name: Not on file   Number of children: Not on file   Years of education: Not on file   Highest education level: Not on file  Occupational History   Not on file  Tobacco Use   Smoking status: Never   Smokeless tobacco: Never  Substance and Sexual Activity   Alcohol use: Not Currently   Drug use: Yes    Types: Marijuana   Sexual activity: Not on file  Other Topics Concern   Not on file  Social History Narrative   Not on file   Social Determinants of Health   Financial Resource Strain: Not on file  Food Insecurity: Not on file  Transportation Needs: Not on file  Physical Activity: Not on file  Stress: Not on file  Social Connections: Not on file     Hospital Course: Patient admitted to the psychiatric unit.  She denied symptoms.  She did appear somewhat odd at times in her affect and behavior.  Nursing staff found her to be intrusive at times but she did not interfere with treatment of other patients.  She consistently denied any suicidal or homicidal ideation.  She denied any hallucinations.  Patient refused to take any medication.  IM olanzapine was administered twice IM by nursing staff which required brief manual holds.  After speaking with the patient at length and reviewing the chart including the information and the commitment petition it appears that there is no real dangerousness that is being alleged.  Patient has been counseled that her actions around her neighbor's property are unwelcome and she should stop them.  Furthermore I have strongly encouraged her to voluntarily follow up with outpatient mental health treatment and she will be given the appropriate referral.  She will not be prescribed any medication at discharge as she is refusing to take it.  Physical Findings: AIMS: Facial and Oral Movements Muscles of Facial Expression: None, normal Lips and Perioral Area: None, normal Jaw: None, normal Tongue: None, normal,Extremity Movements Upper (arms, wrists, hands, fingers): None, normal Lower (legs, knees, ankles, toes): None, normal, Trunk Movements Neck, shoulders, hips: None, normal, Overall Severity Severity of abnormal movements (highest score from questions above): None, normal Incapacitation due to abnormal movements: None, normal Patient's awareness of abnormal movements (rate only patient's report): No Awareness,  Dental Status Current problems with teeth and/or dentures?: No Does patient usually wear dentures?: No  CIWA:    COWS:     Musculoskeletal: Strength & Muscle Tone: within normal limits Gait & Station: normal Patient leans: N/A   Psychiatric Specialty Exam:  Presentation  General Appearance:  Bizarre  Eye Contact:Fair  Speech:Pressured  Speech Volume:Normal  Handedness:Right   Mood and Affect  Mood:Anxious; Labile  Affect:Congruent   Thought Process  Thought Processes:Disorganized  Descriptions of Associations:Loose  Orientation:Full (Time, Place and Person)  Thought Content:Abstract Reasoning; Scattered  History of Schizophrenia/Schizoaffective disorder:No  Duration of Psychotic Symptoms:No data recorded Hallucinations:No data recorded Ideas of Reference:None  Suicidal Thoughts:No data recorded Homicidal Thoughts:No data recorded  Sensorium  Memory:Immediate Poor; Remote Fair  Judgment:Impaired  Insight:Lacking   Executive Functions  Concentration:Poor  Attention Span:Fair  Recall:Poor  Fund of Knowledge:Fair  Language:Fair   Psychomotor Activity  Psychomotor Activity:No data recorded  Assets  Assets:Communication Skills; Financial Resources/Insurance; Housing   Sleep  Sleep:No data recorded   Physical Exam: Physical Exam Vitals and nursing note reviewed.  Constitutional:      Appearance: Normal appearance.  HENT:     Head: Normocephalic and atraumatic.     Mouth/Throat:     Pharynx: Oropharynx is clear.  Eyes:     Pupils: Pupils are equal, round, and reactive to light.  Cardiovascular:     Rate and Rhythm: Normal rate and regular rhythm.  Pulmonary:     Effort: Pulmonary effort is normal.     Breath sounds: Normal breath sounds.  Abdominal:     General: Abdomen is flat.     Palpations: Abdomen is soft.  Musculoskeletal:        General: Normal range of motion.  Skin:    General: Skin is warm and dry.  Neurological:     General: No focal deficit present.     Mental Status: She is alert. Mental status is at baseline.  Psychiatric:        Attention and Perception: Attention normal.        Mood and Affect: Mood normal.        Speech: Speech normal.        Behavior: Behavior is cooperative.        Thought  Content: Thought content normal.        Cognition and Memory: Cognition normal.        Judgment: Judgment normal.    Review of Systems  Constitutional: Negative.   HENT: Negative.    Eyes: Negative.   Respiratory: Negative.    Cardiovascular: Negative.   Gastrointestinal: Negative.   Musculoskeletal: Negative.   Skin: Negative.   Neurological: Negative.   Psychiatric/Behavioral: Negative.     Blood pressure 105/72, pulse 87, temperature 98.4 F (36.9 C), resp. rate 18, height 5\' 8"  (1.727 m), weight 74.6 kg, SpO2 100 %. Body mass index is 25.01 kg/m.   Social History   Tobacco Use  Smoking Status Never  Smokeless Tobacco Never   Tobacco Cessation:  N/A, patient does not currently use tobacco products   Blood Alcohol level:  Lab Results  Component Value Date   ETH <10 12/04/2021    Metabolic Disorder Labs:  No results found for: "HGBA1C", "MPG" No results found for: "PROLACTIN" No results found for: "CHOL", "TRIG", "HDL", "CHOLHDL", "VLDL", "LDLCALC"  See Psychiatric Specialty Exam and Suicide Risk Assessment completed by Attending Physician prior to discharge.  Discharge destination:  Home  Is patient on multiple antipsychotic therapies at  discharge:  No   Has Patient had three or more failed trials of antipsychotic monotherapy by history:  No  Recommended Plan for Multiple Antipsychotic Therapies: NA  Discharge Instructions     Diet - low sodium heart healthy   Complete by: As directed    Increase activity slowly   Complete by: As directed       Allergies as of 12/09/2021   No Known Allergies      Medication List    You have not been prescribed any medications.     Follow-up Information     [facility] Follow up on 12/09/2021.   Why: Though you declined follow up services, please use this resource if the need arises. You can schedule an appointment Saturday June 17th at 8am for an appointment. Walk-in hours are Monday-Sunday  8am-12am. Thanks Contact information: [address] [place] [address] [phone]         [facility] Follow up on 12/09/2021.   Specialty: Urgent Care Why: Though you declined follow up services, here is a resource. Here is information for scheduling your appointment. You can schedule Medication managment appointment Monday June 19th at 7:30am and you can schedule an appointment for therapy Wednesday June 21st at 7:30am. Contact information: [address] [place] [address] [phone]                Follow-up recommendations: Patient certainly appears idiosyncratic and has had some behaviors in her neighborhood that have disturbed people around her but there is no allegation I see of her actually being a threat of dangerousness to herself or to anyone else.  It remains somewhat unclear to me whether any of this is driven by true psychosis or perhaps just inappropriate social boundaries and peculiar thinking.  Patient nevertheless is strongly encouraged to follow up with outpatient mental health treatment for assessment  Comments: No prescriptions provided.  Signed: Mordecai Rasmussen, MD 12/09/2021, 10:53 AM

## 2023-07-16 IMAGING — CT CT HEAD W/O CM
4 series · 16 of 47 positions shown, 18 images · non-contrast
Comparison: None Available.

CLINICAL DATA: Dizziness.



[Series 2: head wo · axial · 0.41mm/px · z∈[-158,-38]mm · 7 of 34 slices shown, 9 images]
[im 5/34  brain]
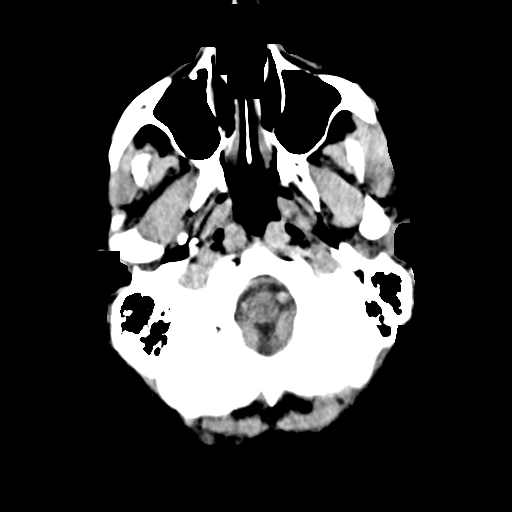
[im 5/34  bone]
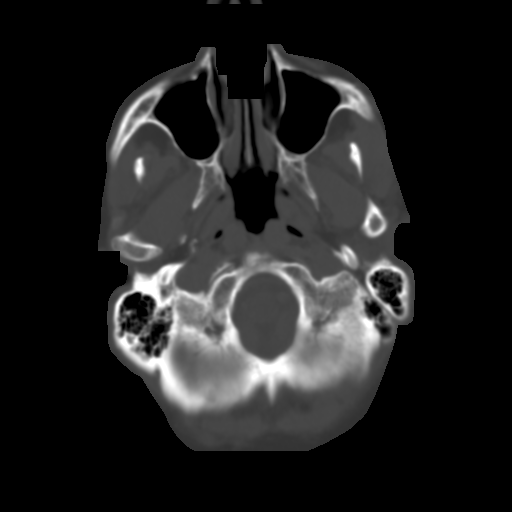
[im 9/34  brain]
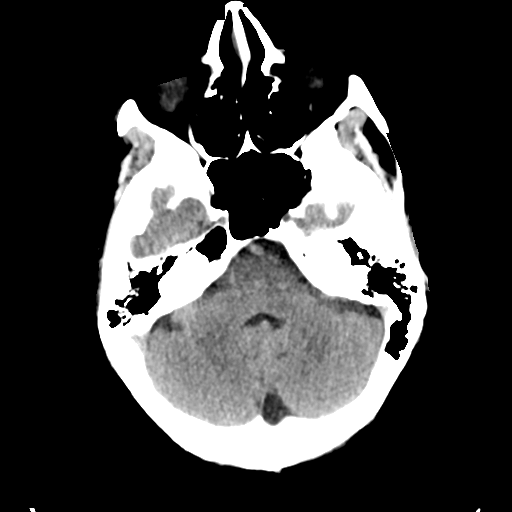
[im 13/34  brain]
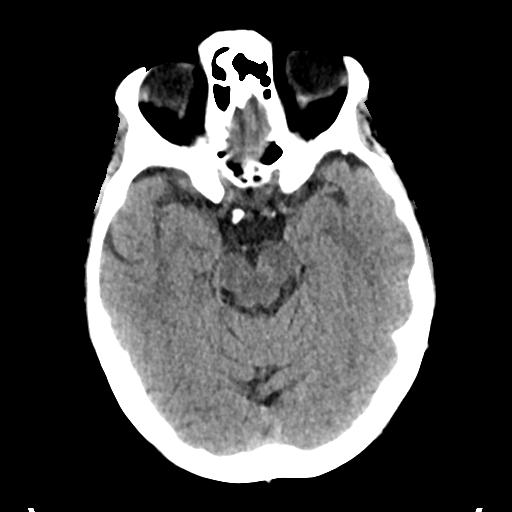
[im 17/34  brain]
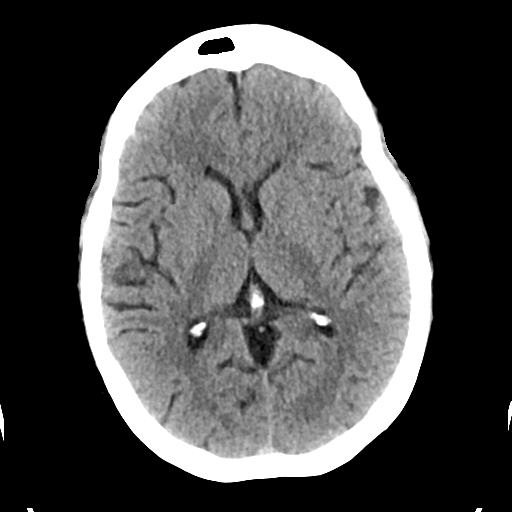
[im 21/34  brain]
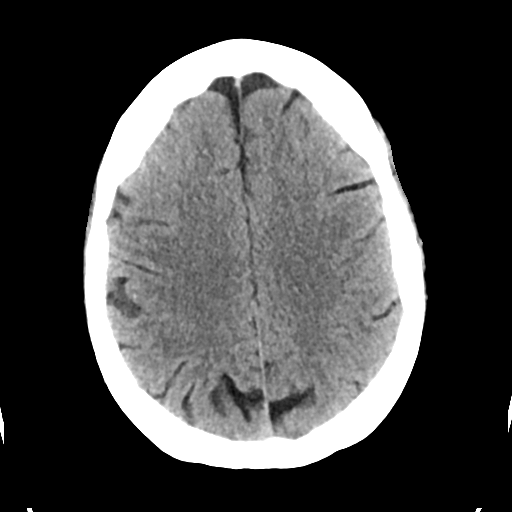
[im 21/34  bone]
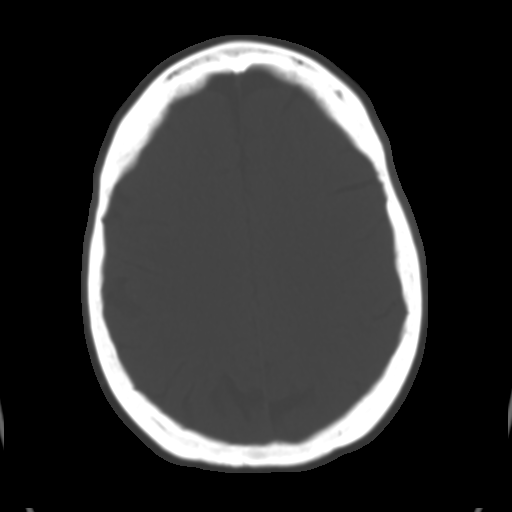
[im 25/34  brain]
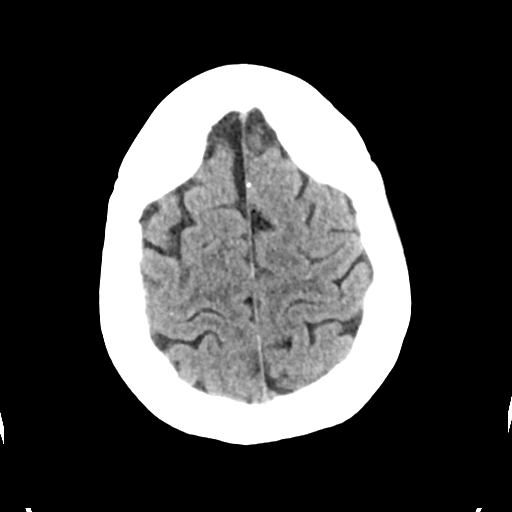
[im 29/34  brain]
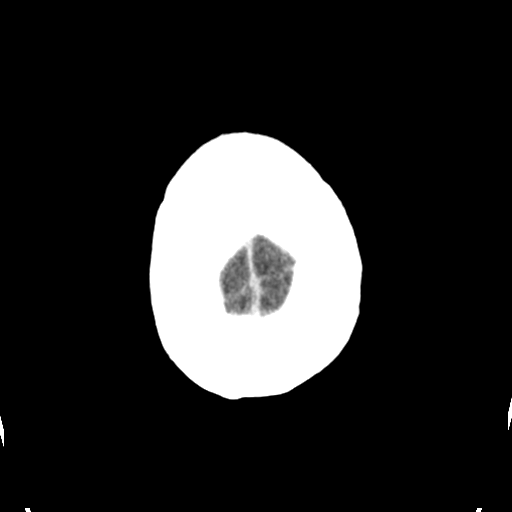

[Series 3: head bone · axial · 0.41mm/px · z∈[-162,-130]mm · 3 of 83 slices shown]
[im 9/83  bone]
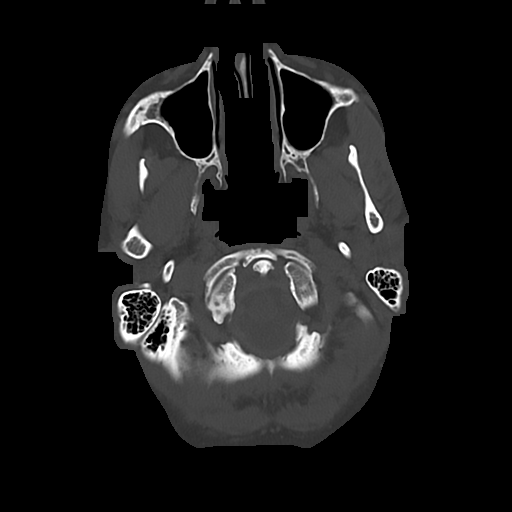
[im 17/83  bone]
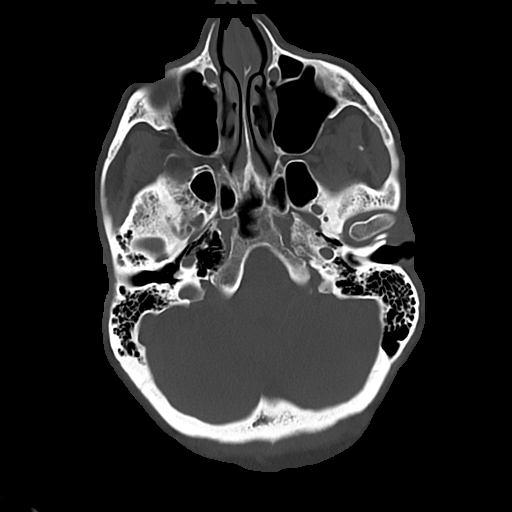
[im 25/83  bone]
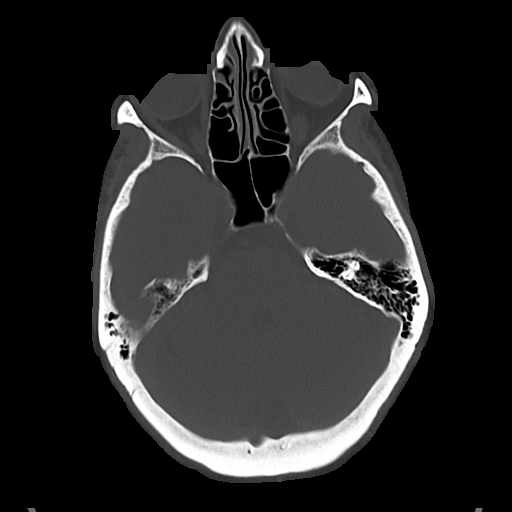

[Series 4: cor soft · coronal · 0.35mm/px · 3 of 75 slices shown]
[im 25/75  brain]
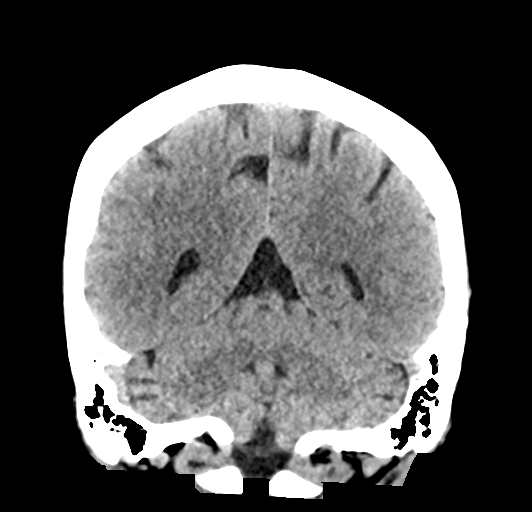
[im 33/75  brain]
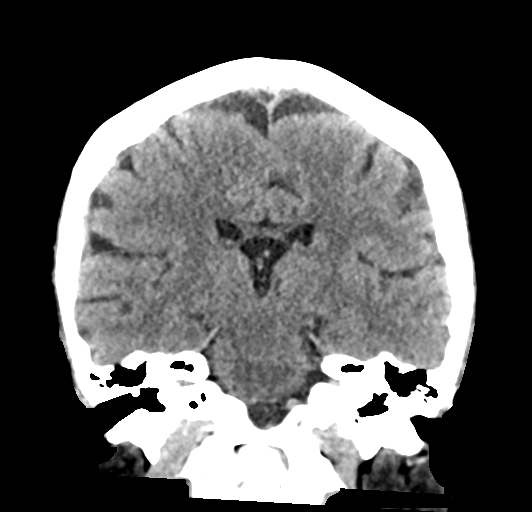
[im 42/75  brain]
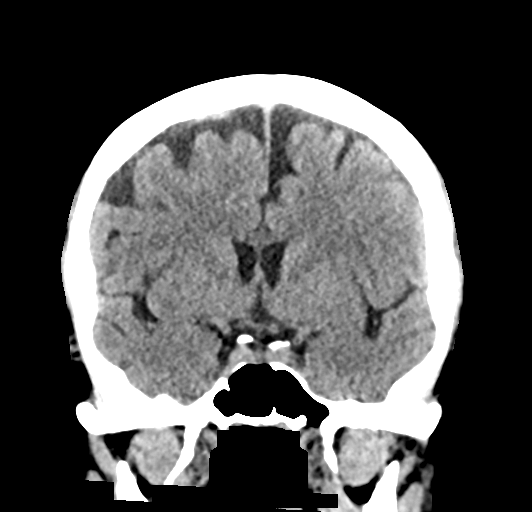

[Series 5: sag soft · sagittal · 0.38mm/px · 3 of 62 slices shown]
[im 21/62  brain]
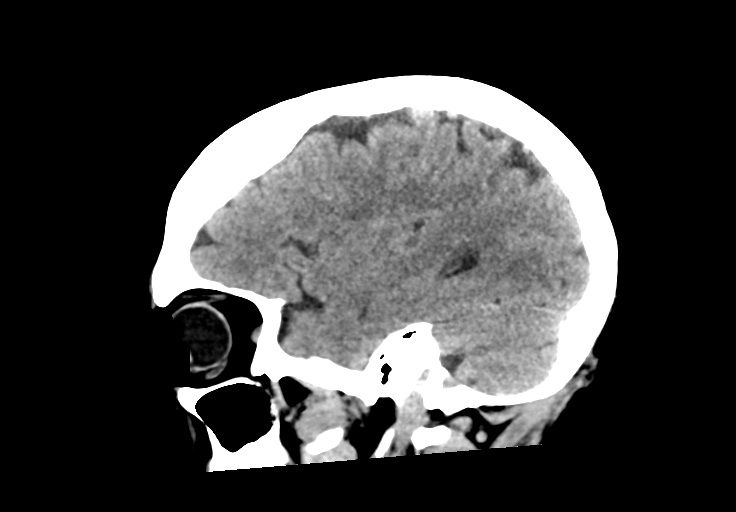
[im 31/62  brain]
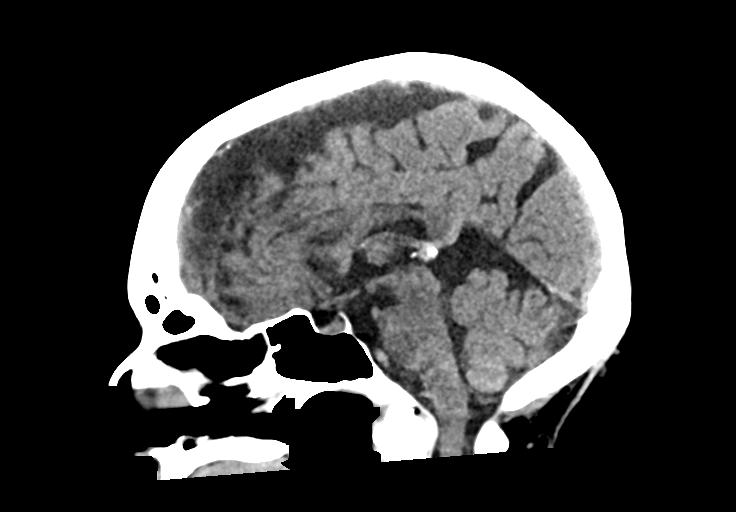
[im 41/62  brain]
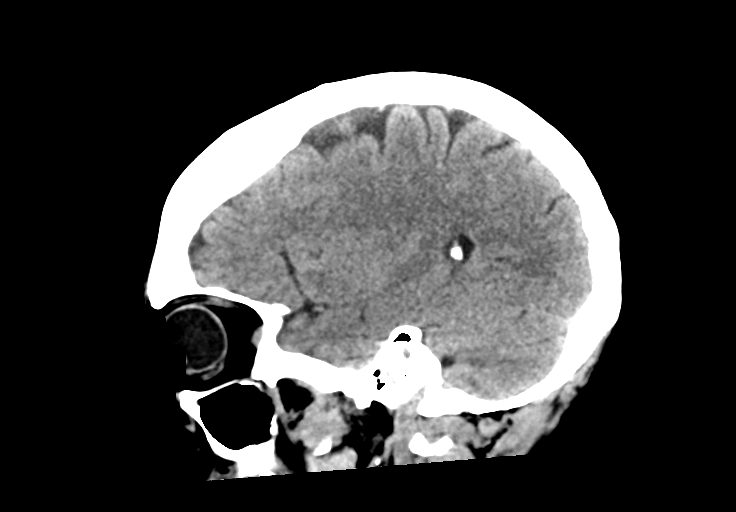

[16 of 47 positions shown; findings below may reference images not displayed]

FINDINGS: Brain: No evidence of acute infarction, hemorrhage, hydrocephalus,
extra-axial collection or mass lesion/mass effect.

Vascular: No hyperdense vessel or unexpected calcification.

Skull: Normal. Negative for fracture or focal lesion.

Sinuses/Orbits: No acute finding.

Other: None.
IMPRESSION: No acute intracranial pathology.
